# Patient Record
Sex: Female | Born: 1995 | Race: Asian | Hispanic: No | Marital: Single | Smoking: Never smoker
Health system: Southern US, Community
[De-identification: ages and names within clinical notes are randomized; demographics above are authoritative.]

## PROBLEM LIST (undated history)

## (undated) DIAGNOSIS — F329 Major depressive disorder, single episode, unspecified: Secondary | ICD-10-CM

## (undated) DIAGNOSIS — F32A Depression, unspecified: Secondary | ICD-10-CM

## (undated) DIAGNOSIS — F419 Anxiety disorder, unspecified: Secondary | ICD-10-CM

## (undated) HISTORY — DX: Major depressive disorder, single episode, unspecified: F32.9

## (undated) HISTORY — DX: Anxiety disorder, unspecified: F41.9

## (undated) HISTORY — DX: Depression, unspecified: F32.A

---
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
## 2014-01-01 ENCOUNTER — Ambulatory Visit (INDEPENDENT_AMBULATORY_CARE_PROVIDER_SITE_OTHER): Payer: BC Managed Care – PPO | Admitting: Family Medicine

## 2014-01-01 VITALS — BP 100/66 | HR 78 | Temp 97.9°F | Resp 18 | Ht 63.5 in | Wt 139.0 lb

## 2014-01-01 DIAGNOSIS — K089 Disorder of teeth and supporting structures, unspecified: Secondary | ICD-10-CM

## 2014-01-01 DIAGNOSIS — K0889 Other specified disorders of teeth and supporting structures: Secondary | ICD-10-CM

## 2014-01-01 MED ORDER — PENICILLIN V POTASSIUM 500 MG PO TABS: 500.0000 mg | ORAL_TABLET | Freq: Two times a day (BID) | ORAL | Status: DC

## 2014-01-01 MED ORDER — MELOXICAM 7.5 MG PO TABS: 7.5000 mg | ORAL_TABLET | Freq: Every day | ORAL | Status: DC

## 2014-01-01 NOTE — Progress Notes (Signed)
[facility] [address] [place] [address] [phone]  Date:  01/01/2014   Name:  Jenny Parks   DOB:  [birth year]   MRN:  [id number]  PCP:  Default, Provider, MD    Chief Complaint: Dental Pain   History of Present Illness:  Jenny Parks is a 18 y.o. very pleasant female patient who presents with the following:  She has had intermittent trouble with a tooth for a few months.  It is swollen again and painful for about 24 hours.   She is generally healthy.   He LMP was less than a month ago.  She has not noted a fever or vomiting.   She is not using any OTC medications She has not been to the dentist in a long time.   There are no active problems to display for this patient.   History reviewed. No pertinent past medical history.  History reviewed. No pertinent past surgical history.  History  Substance Use Topics  . Smoking status: Never Smoker   . Smokeless tobacco: Not on file  . Alcohol Use: No    Family History  Problem Relation Age of Onset  . Diabetes Paternal Grandfather     No Known Allergies  Medication list has been reviewed and updated.  Current Outpatient Prescriptions on File Prior to Visit  Medication Sig Dispense Refill  . triamcinolone cream (KENALOG) 0.1 % Apply topically 2 (two) times daily.  30 g  0   No current facility-administered medications on file prior to visit.    Review of Systems:  As per HPI- otherwise negative.   Physical Examination: Filed Vitals:   01/01/14 1708  BP: 100/66  Pulse: 78  Temp: 97.9 F (36.6 C)  Resp: 18   Filed Vitals:   01/01/14 1708  Height: 5' 3.5" (1.613 m)  Weight: 139 lb (63.05 kg)   Body mass index is 24.23 kg/(m^2). Ideal Body Weight: Weight in (lb) to have BMI = 25: 143.1  GEN: WDWN, NAD, Non-toxic, A & O x 3, looks well HEENT: Atraumatic, Normocephalic. Neck supple. No masses, No LAD.  Bilateral TM wnl, oropharynx normal except as below.  PEERL,EOMI.   Her teeth  show signs of dental neglect.  She has redness, tenderness and inflammation of the gum around #6 and 7.  No apparent pus collection in need of drainage.   Ears and Nose: No external deformity. CV: RRR, No M/G/R. No JVD. No thrill. No extra heart sounds. PULM: CTA B, no wheezes, crackles, rhonchi. No retractions. No resp. distress. No accessory muscle use. EXTR: No c/c/e NEURO Normal gait.  PSYCH: Normally interactive. Conversant. Not depressed or anxious appearing.  Calm demeanor.    Assessment and Plan: Pain, dental - Plan: penicillin v potassium (VEETID) 500 MG tablet, meloxicam (MOBIC) 7.5 MG tablet  Recurrent dental pain.  She appears to have a mild infection, and is badly in need of a dental cleaning.  Encouraged her to look into her dental insurance options.  Penicillin BID, and mobic as needed for pain.  She will follow-up if not better soon.    Signed Abbe Amsterdam, MD

## 2014-01-01 NOTE — Patient Instructions (Signed)
Use the penicillin as directed for your tooth infection.  You can use the mobic as needed for pain- 1 or 2 pills daily.  If your pain or swelling gets worse please come back in.   Please look into dental insurance.  If you are not able to get insurance you should still try and get your teeth cleaned and evaluated as soon as you can.

## 2014-02-03 ENCOUNTER — Ambulatory Visit (INDEPENDENT_AMBULATORY_CARE_PROVIDER_SITE_OTHER): Payer: BC Managed Care – PPO | Admitting: Family Medicine

## 2014-02-03 VITALS — BP 104/62 | HR 76 | Temp 98.6°F | Resp 18 | Ht 62.75 in | Wt 139.6 lb

## 2014-02-03 DIAGNOSIS — R21 Rash and other nonspecific skin eruption: Secondary | ICD-10-CM

## 2014-02-03 DIAGNOSIS — L259 Unspecified contact dermatitis, unspecified cause: Secondary | ICD-10-CM

## 2014-02-03 DIAGNOSIS — L309 Dermatitis, unspecified: Secondary | ICD-10-CM

## 2014-02-03 MED ORDER — NYSTATIN 100000 UNIT/GM EX CREA: 1.0000 "application " | TOPICAL_CREAM | Freq: Two times a day (BID) | CUTANEOUS | Status: DC

## 2014-02-03 MED ORDER — TRIAMCINOLONE ACETONIDE 0.1 % EX CREA: TOPICAL_CREAM | Freq: Two times a day (BID) | CUTANEOUS | Status: DC

## 2014-02-03 MED ORDER — DESONIDE 0.05 % EX CREA: TOPICAL_CREAM | Freq: Two times a day (BID) | CUTANEOUS | Status: DC | PRN

## 2014-02-03 NOTE — Progress Notes (Signed)
Chief Complaint:  Chief Complaint  Patient presents with  . Rash    rash around the both eyes and the neck --started 1 year ago and it just came back again--feels swollen--itchings    HPI: Jenny Parks is a 18 y.o. female who is here for rash on bialteral eye lids and also chronic rash on chest. She deneis fungal infections or sweating a lot.  She had allergies to shrimp and seafood.  Has had rash on eyes, attributed to using new mositurizer, has stopped using it but still present. + itchiness Has had neck rash, on and off for the last 1 year, gets better then gets worses, she has some dryness and itching , has no new meds or soaps except did have a new mositurizer which was only used on her face.  She denies any seasonal allergies. Has tried rx steroid cream we have given her in the past that has worked. She denies fevers, chills, new travels, new meds, new food, new clothes She is going to [employer] in Fall for a degree in elementary education   History reviewed. No pertinent past medical history. History reviewed. No pertinent past surgical history. History   Social History  . Marital Status: Single    Spouse Name: N/A    Number of Children: N/A  . Years of Education: N/A   Social History Main Topics  . Smoking status: Never Smoker   . Smokeless tobacco: None  . Alcohol Use: No  . Drug Use: No  . Sexual Activity: None   Other Topics Concern  . None   Social History Narrative  . None   Family History  Problem Relation Age of Onset  . Diabetes Paternal Grandfather    No Known Allergies Prior to Admission medications   Medication Sig Start Date End Date Taking? Authorizing Provider  meloxicam (MOBIC) 7.5 MG tablet Take 1 tablet (7.5 mg total) by mouth daily. Use as needed for pain.  May take 2 a day if needed 01/01/14  Yes Gwenlyn FoundJessica C Copland, MD  penicillin v potassium (VEETID) 500 MG tablet Take 1 tablet (500 mg total) by mouth 2 (two) times daily. 01/01/14  Yes  Gwenlyn FoundJessica C Copland, MD  triamcinolone cream (KENALOG) 0.1 % Apply topically 2 (two) times daily. 11/09/12   Eleanore Delia ChimesE Egan, PA-C     ROS: The patient denies fevers, chills, night sweats, unintentional weight loss, chest pain, palpitations, wheezing, dyspnea on exertion, nausea, vomiting, abdominal pain, dysuria, hematuria, melena, numbness, weakness, or tingling.   All other systems have been reviewed and were otherwise negative with the exception of those mentioned in the HPI and as above.    PHYSICAL EXAM: Filed Vitals:   02/03/14 1005  BP: 104/62  Pulse: 76  Temp: 98.6 F (37 C)  Resp: 18   Filed Vitals:   02/03/14 1005  Height: 5' 2.75" (1.594 m)  Weight: 139 lb 9.6 oz (63.322 kg)   Body mass index is 24.92 kg/(m^2).  General: Alert, no acute distress HEENT:  Normocephalic, atraumatic, oropharynx patent. EOMI, PERRLA Cardiovascular:  Regular rate and rhythm, no rubs murmurs or gallops.  No Carotid bruits, radial pulse intact. No pedal edema.  Respiratory: Clear to auscultation bilaterally.  No wheezes, rales, or rhonchi.  No cyanosis, no use of accessory musculature GI: No organomegaly, abdomen is soft and non-tender, positive bowel sounds.  No masses. Skin: + eczematous ? Fungal rash on neck,  Large macular beffy lesion on red, may have  had satellite lesions but now they have al coalesced into one large palm size rash on chest. + eczema on eyes rashes. Neurologic: Facial musculature symmetric. Psychiatric: Patient is appropriate throughout our interaction. Lymphatic: No cervical lymphadenopathy Musculoskeletal: Gait intact.   LABS: Results for orders placed in visit on 07/05/12  TSH      Result Value Ref Range   TSH 1.728  0.400 - 5.000 uIU/mL  POCT CBC      Result Value Ref Range   WBC 7.4  4.6 - 10.2 K/uL   Lymph, poc 1.2  0.6 - 3.4   POC LYMPH PERCENT 16.8  10 - 50 %L   MID (cbc) 0.3  0 - 0.9   POC MID % 3.4  0 - 12 %M   POC Granulocyte 5.9  2 - 6.9    Granulocyte percent 79.8  37 - 80 %G   RBC 5.25  4.04 - 5.48 M/uL   Hemoglobin 14.8  12.2 - 16.2 g/dL   HCT, POC 28.446.5  13.237.7 - 47.9 %   MCV 88.6  80 - 97 fL   MCH, POC 28.2  27 - 31.2 pg   MCHC 31.8  31.8 - 35.4 g/dL   RDW, POC 44.013.4     Platelet Count, POC 279  142 - 424 K/uL   MPV 9.0  0 - 99.8 fL  POCT URINE PREGNANCY      Result Value Ref Range   Preg Test, Ur Negative       EKG/XRAY:   [facility]   ASSESSMENT/PLAN: Encounter Diagnoses  Name Primary?  . Eczema Yes  . Rash    Rx desonide for eyes Rx kenaolog , nysatin for neck  She will try the nystatin to see if this may be also fungal related. If it does not work then she will continue with kenalog. Offered to send her to alelrgist but she declined F/u prn  Gross sideeffects, risk and benefits, and alternatives of medications d/w patient. Patient is aware that all medications have potential sideeffects and we are unable to predict every sideeffect or drug-drug interaction that may occur.  LE, THAO PHUONG, DO 02/03/2014 11:14 AM

## 2014-03-23 ENCOUNTER — Ambulatory Visit (INDEPENDENT_AMBULATORY_CARE_PROVIDER_SITE_OTHER): Payer: BC Managed Care – PPO | Admitting: Family Medicine

## 2014-03-23 VITALS — BP 98/56 | HR 81 | Temp 97.2°F | Resp 16 | Ht 63.0 in | Wt 140.2 lb

## 2014-03-23 DIAGNOSIS — L0201 Cutaneous abscess of face: Secondary | ICD-10-CM

## 2014-03-23 DIAGNOSIS — L03211 Cellulitis of face: Principal | ICD-10-CM

## 2014-03-23 MED ORDER — DOXYCYCLINE HYCLATE 100 MG PO CAPS: 100.0000 mg | ORAL_CAPSULE | Freq: Two times a day (BID) | ORAL | Status: DC

## 2014-03-23 NOTE — Progress Notes (Signed)
   Subjective:    Patient ID: Jenny Parks, female    DOB: 1996-07-19, 18 y.o.   MRN: [id number]  HPI Patient presents today with rash below her left eye. It has gotten progressively larger in the last two days and itches. She first noticed it 2 days ago. No other lesions. Does not think she has been exposed to any insects. No other lesions.  She has had several rashes in the past, none like this. She has steroid creams at home, but was afraid to use on her face.  She will be going to college ([employer]) in 5 days. She is a little apprehensive about leaving home. She will be rooming with a good friend of hers. She has never been sexually active.  Review of Systems No fever, no URI symptoms, no drainage from eyes, no visual changes.     Objective:   Physical Exam  Vitals reviewed. Constitutional: She is oriented to person, place, and time. She appears well-developed and well-nourished.  HENT:  Head: Normocephalic and atraumatic.    Right Ear: External ear normal.  Left Ear: External ear normal.  Eyes: Conjunctivae and EOM are normal. Pupils are equal, round, and reactive to light. Right eye exhibits no discharge. Left eye exhibits no discharge.  Neck: Normal range of motion. Neck supple.  Cardiovascular: Normal rate.   Pulmonary/Chest: Effort normal.  Musculoskeletal: Normal range of motion.  Neurological: She is alert and oriented to person, place, and time.  Skin: Skin is warm and dry.  Psychiatric: She has a normal mood and affect. Her behavior is normal. Judgment and thought content normal.       Assessment & Plan:  Discussed with Dr. Clelia CroftShaw who also examined the patient.  1. Cellulitis and abscess of face - doxycycline (VIBRAMYCIN) 100 MG capsule; Take 1 capsule (100 mg total) by mouth 2 (two) times daily.  Dispense: 20 capsule; Refill: 0 -patient can use desonide that she has at home- twice a day for 7-10 days. Patient instructed to use sparingly. -Instructed patient to RTC if no  improvement in 3-4 days (prior to leaving town) or sooner if worsening or eye involvement.  Emi Belfasteborah B. Gessner, FNP-BC  [facility]  03/25/2014 9:13 PM

## 2014-03-23 NOTE — Patient Instructions (Addendum)
Use desonide cream sparingly 2 times a day to rash Take antibiotic until finished Can take benadryl (generic fine) for itching  Cellulitis Cellulitis is an infection of the skin and the tissue beneath it. The infected area is usually red and tender. Cellulitis occurs most often in the arms and lower legs.  CAUSES  Cellulitis is caused by bacteria that enter the skin through cracks or cuts in the skin. The most common types of bacteria that cause cellulitis are staphylococci and streptococci. SIGNS AND SYMPTOMS   Redness and warmth.  Swelling.  Tenderness or pain.  Fever. DIAGNOSIS  Your health care provider can usually determine what is wrong based on a physical exam. Blood tests may also be done. TREATMENT  Treatment usually involves taking an antibiotic medicine. HOME CARE INSTRUCTIONS   Take your antibiotic medicine as directed by your health care provider. Finish the antibiotic even if you start to feel better.  Keep the infected arm or leg elevated to reduce swelling.  Apply a warm cloth to the affected area up to 4 times per day to relieve pain.  Take medicines only as directed by your health care provider.  Keep all follow-up visits as directed by your health care provider. SEEK MEDICAL CARE IF:   You notice red streaks coming from the infected area.  Your red area gets larger or turns dark in color.  Your bone or joint underneath the infected area becomes painful after the skin has healed.  Your infection returns in the same area or another area.  You notice a swollen bump in the infected area.  You develop new symptoms.  You have a fever. SEEK IMMEDIATE MEDICAL CARE IF:   You feel very sleepy.  You develop vomiting or diarrhea.  You have a general ill feeling (malaise) with muscle aches and pains. MAKE SURE YOU:   Understand these instructions.  Will watch your condition.  Will get help right away if you are not doing well or get worse. Document  Released: 05/03/2005 Document Revised: 12/08/2013 Document Reviewed: 10/09/2011 [facility] Patient Information 2015 [employer], [place]. This information is not intended to replace advice given to you by your health care provider. Make sure you discuss any questions you have with your health care provider.

## 2014-06-26 ENCOUNTER — Other Ambulatory Visit: Payer: Self-pay | Admitting: Family Medicine

## 2015-08-05 ENCOUNTER — Ambulatory Visit (INDEPENDENT_AMBULATORY_CARE_PROVIDER_SITE_OTHER): Payer: BLUE CROSS/BLUE SHIELD

## 2015-08-05 ENCOUNTER — Ambulatory Visit (INDEPENDENT_AMBULATORY_CARE_PROVIDER_SITE_OTHER): Payer: BLUE CROSS/BLUE SHIELD | Admitting: Emergency Medicine

## 2015-08-05 VITALS — BP 112/72 | HR 85 | Temp 97.9°F | Resp 16 | Ht 64.0 in | Wt 142.0 lb

## 2015-08-05 DIAGNOSIS — R002 Palpitations: Secondary | ICD-10-CM | POA: Diagnosis not present

## 2015-08-05 DIAGNOSIS — R0602 Shortness of breath: Secondary | ICD-10-CM

## 2015-08-05 DIAGNOSIS — R112 Nausea with vomiting, unspecified: Secondary | ICD-10-CM

## 2015-08-05 DIAGNOSIS — F411 Generalized anxiety disorder: Secondary | ICD-10-CM | POA: Diagnosis not present

## 2015-08-05 LAB — COMPREHENSIVE METABOLIC PANEL
ALK PHOS: 68 U/L (ref 33–115)
ALT: 10 U/L (ref 6–29)
AST: 15 U/L (ref 10–30)
Albumin: 5.3 g/dL — ABNORMAL HIGH (ref 3.6–5.1)
BUN: 9 mg/dL (ref 7–25)
CO2: 23 mmol/L (ref 20–31)
CREATININE: 0.61 mg/dL (ref 0.50–1.10)
Calcium: 9.9 mg/dL (ref 8.6–10.2)
Chloride: 102 mmol/L (ref 98–110)
GLUCOSE: 74 mg/dL (ref 65–99)
Potassium: 3.8 mmol/L (ref 3.5–5.3)
SODIUM: 138 mmol/L (ref 135–146)
Total Bilirubin: 0.9 mg/dL (ref 0.2–1.2)
Total Protein: 8.2 g/dL — ABNORMAL HIGH (ref 6.1–8.1)

## 2015-08-05 LAB — POCT CBC
Granulocyte percent: 62.7 %G (ref 37–80)
HCT, POC: 42 % (ref 37.7–47.9)
HEMOGLOBIN: 14.4 g/dL (ref 12.2–16.2)
LYMPH, POC: 1.6 (ref 0.6–3.4)
MCH, POC: 27.9 pg (ref 27–31.2)
MCHC: 34.3 g/dL (ref 31.8–35.4)
MCV: 81.4 fL (ref 80–97)
MID (cbc): 0.2 (ref 0–0.9)
MPV: 7.2 fL (ref 0–99.8)
PLATELET COUNT, POC: 242 10*3/uL (ref 142–424)
POC Granulocyte: 3.1 (ref 2–6.9)
POC LYMPH PERCENT: 32.3 %L (ref 10–50)
POC MID %: 5 %M (ref 0–12)
RBC: 5.16 M/uL (ref 4.04–5.48)
RDW, POC: 12.6 %
WBC: 4.9 10*3/uL (ref 4.6–10.2)

## 2015-08-05 LAB — LIPASE: Lipase: 27 U/L (ref 7–60)

## 2015-08-05 LAB — POCT URINE PREGNANCY: Preg Test, Ur: NEGATIVE

## 2015-08-05 MED ORDER — PAROXETINE HCL 20 MG PO TABS: 20.0000 mg | ORAL_TABLET | Freq: Every day | ORAL | Status: DC

## 2015-08-05 MED ORDER — LORAZEPAM 1 MG PO TABS: 1.0000 mg | ORAL_TABLET | Freq: Three times a day (TID) | ORAL | Status: DC | PRN

## 2015-08-05 MED ORDER — ONDANSETRON 8 MG PO TBDP: 8.0000 mg | ORAL_TABLET | Freq: Three times a day (TID) | ORAL | Status: DC | PRN

## 2015-08-05 NOTE — Patient Instructions (Signed)
Generalized Anxiety Disorder Generalized anxiety disorder (GAD) is a mental disorder. It interferes with life functions, including relationships, work, and school. GAD is different from normal anxiety, which everyone experiences at some point in their lives in response to specific life events and activities. Normal anxiety actually helps us prepare for and get through these life events and activities. Normal anxiety goes away after the event or activity is over.  GAD causes anxiety that is not necessarily related to specific events or activities. It also causes excess anxiety in proportion to specific events or activities. The anxiety associated with GAD is also difficult to control. GAD can vary from mild to severe. People with severe GAD can have intense waves of anxiety with physical symptoms (panic attacks).  SYMPTOMS The anxiety and worry associated with GAD are difficult to control. This anxiety and worry are related to many life events and activities and also occur more days than not for 6 months or longer. People with GAD also have three or more of the following symptoms (one or more in children):  Restlessness.   Fatigue.  Difficulty concentrating.   Irritability.  Muscle tension.  Difficulty sleeping or unsatisfying sleep. DIAGNOSIS GAD is diagnosed through an assessment by your health care provider. Your health care provider will ask you questions aboutyour mood,physical symptoms, and events in your life. Your health care provider may ask you about your medical history and use of alcohol or drugs, including prescription medicines. Your health care provider may also do a physical exam and blood tests. Certain medical conditions and the use of certain substances can cause symptoms similar to those associated with GAD. Your health care provider may refer you to a mental health specialist for further evaluation. TREATMENT The following therapies are usually used to treat GAD:    Medication. Antidepressant medication usually is prescribed for long-term daily control. Antianxiety medicines may be added in severe cases, especially when panic attacks occur.   Talk therapy (psychotherapy). Certain types of talk therapy can be helpful in treating GAD by providing support, education, and guidance. A form of talk therapy called cognitive behavioral therapy can teach you healthy ways to think about and react to daily life events and activities.  Stress managementtechniques. These include yoga, meditation, and exercise and can be very helpful when they are practiced regularly. A mental health specialist can help determine which treatment is best for you. Some people see improvement with one therapy. However, other people require a combination of therapies.   This information is not intended to replace advice given to you by your health care provider. Make sure you discuss any questions you have with your health care provider.   Document Released: 11/18/2012 Document Revised: 08/14/2014 Document Reviewed: 11/18/2012 Elsevier Interactive Patient Education 2016 Elsevier Inc.  

## 2015-08-05 NOTE — Progress Notes (Signed)
Subjective:  Patient ID: Jenny Parks, female    DOB: Oct 11, 1995  Age: 19 y.o. MRN: [id number]  CC: tightness in chest; heartbeat concerns; Cough; Bloated; and Nausea   HPI Jenny Parks presents   With numerous complaints. She describes having shortness of breath and tightness in her chest. This occurs intermittently. He is usually associated with the sensation in her heart is racing and skipping beats. She has no sputum production wheezing. She has no history of hemoptysis. No history of asthma or reactive airway disease. She said that she has abdominal discomfort nausea and some sensation in her abdomen is bloated. Pregnant. She has no dysuria urgency or frequency has no stool change or food intolerance. She has no symptoms suggestive of reflux. And she's not a smoker drinks alcohol occasionally in the does not overdo caffeine. She does admit to significant anxiety. 2  History Jenny Parks has no past medical history on file.   She has no past surgical history on file.   Her  family history includes Diabetes in her paternal grandfather.  She   reports that she has never smoked. She has never used smokeless tobacco. She reports that she does not drink alcohol or use illicit drugs.  Outpatient Prescriptions Prior to Visit  Medication Sig Dispense Refill  . desonide (DESOWEN) 0.05 % cream Apply topically 2 (two) times daily as needed. (Patient not taking: Reported on 08/05/2015) 30 g 0  . doxycycline (VIBRAMYCIN) 100 MG capsule Take 1 capsule (100 mg total) by mouth 2 (two) times daily. (Patient not taking: Reported on 08/05/2015) 20 capsule 0  . meloxicam (MOBIC) 7.5 MG tablet Take 1 tablet (7.5 mg total) by mouth daily. Use as needed for pain.  May take 2 a day if needed (Patient not taking: Reported on 08/05/2015) 30 tablet 0  . nystatin cream (MYCOSTATIN) Apply 1 application topically 2 (two) times daily. (Patient not taking: Reported on 08/05/2015) 30 g 0  . triamcinolone cream (KENALOG) 0.1  % Apply topically 2 (two) times daily. (Patient not taking: Reported on 08/05/2015) 30 g 2   No facility-administered medications prior to visit.    Social History   Social History  . Marital Status: Single    Spouse Name: N/A  . Number of Children: N/A  . Years of Education: N/A   Social History Main Topics  . Smoking status: Never Smoker   . Smokeless tobacco: Never Used  . Alcohol Use: No  . Drug Use: No  . Sexual Activity: Not Asked   Other Topics Concern  . None   Social History Narrative     Review of Systems  Constitutional: Negative for fever, chills and appetite change.  HENT: Negative for congestion, ear pain, postnasal drip, sinus pressure and sore throat.   Eyes: Negative for pain and redness.  Respiratory: Positive for chest tightness and shortness of breath. Negative for cough and wheezing.   Cardiovascular: Positive for palpitations. Negative for leg swelling.  Gastrointestinal: Positive for nausea, vomiting and abdominal distention. Negative for abdominal pain, diarrhea, constipation and blood in stool.  Endocrine: Negative for polyuria.  Genitourinary: Negative for dysuria, urgency, frequency and flank pain.  Musculoskeletal: Negative for gait problem.  Skin: Negative for rash.  Neurological: Negative for weakness and headaches.  Psychiatric/Behavioral: Negative for confusion and decreased concentration. The patient is not nervous/anxious.     Objective:  BP 112/72 mmHg  Pulse 85  Temp(Src) 97.9 F (36.6 C) (Oral)  Resp 16  Ht  (1.626 m)  Wt 142 lb (64.411 kg)  BMI 24.36 kg/m2  SpO2 98%  LMP 07/07/2015 (Approximate)  Physical Exam  Constitutional: She is oriented to person, place, and time. She appears well-developed and well-nourished. No distress.  HENT:  Head: Normocephalic and atraumatic.  Right Ear: External ear normal.  Left Ear: External ear normal.  Nose: Nose normal.  Eyes: Conjunctivae and EOM are normal. Pupils are equal,  round, and reactive to light. No scleral icterus.  Neck: Normal range of motion. Neck supple. No tracheal deviation present.  Cardiovascular: Normal rate, regular rhythm and normal heart sounds.   Pulmonary/Chest: Effort normal. No respiratory distress. She has no wheezes. She has no rales.  Abdominal: She exhibits no mass. There is no tenderness. There is no rebound and no guarding.  Musculoskeletal: She exhibits no edema.  Lymphadenopathy:    She has no cervical adenopathy.  Neurological: She is alert and oriented to person, place, and time. Coordination normal.  Skin: Skin is warm and dry. No rash noted.  Psychiatric: She has a normal mood and affect. Her behavior is normal.      Assessment & Plan:   Jenny Parks was seen today for tightness in chest, heartbeat concerns, cough, bloated and nausea.  Diagnoses and all orders for this visit:  Palpitations -     POCT CBC -     Comprehensive metabolic panel -     Lipase -     EKG 12-Lead -     DG Chest 2 View; Future -     POCT urine pregnancy -     TSH  Non-intractable vomiting with nausea, unspecified vomiting type -     POCT CBC -     Comprehensive metabolic panel -     Lipase -     EKG 12-Lead -     DG Chest 2 View; Future -     POCT urine pregnancy  Shortness of breath -     POCT CBC -     Comprehensive metabolic panel -     Lipase -     EKG 12-Lead -     DG Chest 2 View; Future -     POCT urine pregnancy  Anxiety state  Other orders -     LORazepam (ATIVAN) 1 MG tablet; Take 1 tablet (1 mg total) by mouth every 8 (eight) hours as needed for anxiety. -     PARoxetine (PAXIL) 20 MG tablet; Take 1 tablet (20 mg total) by mouth daily. -     ondansetron (ZOFRAN-ODT) 8 MG disintegrating tablet; Take 1 tablet (8 mg total) by mouth every 8 (eight) hours as needed for nausea.   I have discontinued Jenny Parks's meloxicam, triamcinolone cream, desonide, nystatin cream, and doxycycline. I am also having her start on LORazepam,  PARoxetine, and ondansetron.  Meds ordered this encounter  Medications  . LORazepam (ATIVAN) 1 MG tablet    Sig: Take 1 tablet (1 mg total) by mouth every 8 (eight) hours as needed for anxiety.    Dispense:  90 tablet    Refill:  0  . PARoxetine (PAXIL) 20 MG tablet    Sig: Take 1 tablet (20 mg total) by mouth daily.    Dispense:  30 tablet    Refill:  5  . ondansetron (ZOFRAN-ODT) 8 MG disintegrating tablet    Sig: Take 1 tablet (8 mg total) by mouth every 8 (eight) hours as needed for nausea.    Dispense:  30 tablet    Refill:  0    Appropriate red flag conditions were discussed with the patient as well as actions that should be taken.  Patient expressed his understanding.  Follow-up: Return if symptoms worsen or fail to improve.  Carmelina DaneAnderson, Jeffery S, MD   [facility] reading (PRIMARY) by  Dr. Dareen PianoAnderson.  Negative chest.   Results for orders placed or performed in visit on 08/05/15  POCT CBC  Result Value Ref Range   WBC 4.9 4.6 - 10.2 K/uL   Lymph, poc 1.6 0.6 - 3.4   POC LYMPH PERCENT 32.3 10 - 50 %L   MID (cbc) 0.2 0 - 0.9   POC MID % 5.0 0 - 12 %M   POC Granulocyte 3.1 2 - 6.9   Granulocyte percent 62.7 37 - 80 %G   RBC 5.16 4.04 - 5.48 M/uL   Hemoglobin 14.4 12.2 - 16.2 g/dL   HCT, POC 96.042.0 45.437.7 - 47.9 %   MCV 81.4 80 - 97 fL   MCH, POC 27.9 27 - 31.2 pg   MCHC 34.3 31.8 - 35.4 g/dL   RDW, POC 09.812.6 %   Platelet Count, POC 242 142 - 424 K/uL   MPV 7.2 0 - 99.8 fL  POCT urine pregnancy  Result Value Ref Range   Preg Test, Ur Negative Negative

## 2015-08-06 LAB — TSH: TSH: 2.58 u[IU]/mL (ref 0.350–4.500)

## 2015-08-08 ENCOUNTER — Encounter: Payer: Self-pay | Admitting: *Deleted

## 2015-09-07 ENCOUNTER — Ambulatory Visit (INDEPENDENT_AMBULATORY_CARE_PROVIDER_SITE_OTHER): Payer: BLUE CROSS/BLUE SHIELD | Admitting: Family Medicine

## 2015-09-07 VITALS — BP 122/72 | HR 86 | Temp 98.5°F | Resp 17 | Ht 63.5 in | Wt 142.0 lb

## 2015-09-07 DIAGNOSIS — K589 Irritable bowel syndrome without diarrhea: Secondary | ICD-10-CM | POA: Diagnosis not present

## 2015-09-07 MED ORDER — DICYCLOMINE HCL 20 MG PO TABS: 20.0000 mg | ORAL_TABLET | Freq: Three times a day (TID) | ORAL | Status: AC

## 2015-09-07 NOTE — Patient Instructions (Signed)
I think that you likely have irritable bowel syndrome- this is not a dangerous condition but can cause bothersome symptoms of diarrhea and constipation  Try the bentyl medication 4x a day for a couple of weeks and let me know how this does for you.  If you do NOT feel better we can have you see a GI specialist.   Try to keep to a regular eating, exercise, and bathroom schedule. Focus on eating healthy foods with plenty of fiber (fruits and veggies) If you are getting worse or if you see any more blood please let me know

## 2015-09-07 NOTE — Progress Notes (Signed)
[facility] [address] [place] [address] [phone]  Date:  09/07/2015   Name:  Jenny Parks   DOB:  May 28, 1996   MRN:  [id number]  PCP:  Default, Provider, MD    Chief Complaint: Abdominal Pain and Diarrhea   History of Present Illness:  Jenny Parks is a 20 y.o. very pleasant female patient who presents with the following:  She has noted digestive problems since December of 2016.  It seemed to start after she ate some garlic bread.  She has not noted any connection to certain foods more recently.   She was taking probioitcs and laxatives at home. She has noted alternating diarrhea and constipation.  She has felt bloated. No nausea. No weight change She is unsure if there has been any mucus in her stools- she did not really notice She will have stomach pains some of the time.   She will feel better after she has a BM generally No vomiting  Wt Readings from Last 3 Encounters:  09/07/15 142 lb (64.411 kg)  08/05/15 142 lb (64.411 kg)  03/23/14 140 lb 3.2 oz (63.594 kg) (71 %*, Z = 0.56)   * Growth percentiles are based on CDC 2-20 Years data.   She is not aware of any family history of any stomach concerns, UC, etc She noted some bright red blood in the tissue after a BM today.  It was just on the tissue and she thought it was due to trauma from frequent BM.  The BM she had prior was uncomfortable  She had an unusually light menses last week.  She has never been sexualy active- not even once- so pregnancy is not a possibility.  We also did a negaitve HCG for her about 2 weeks ago when she was seen with palpitations.  We did CMP and CBC  There are no active problems to display for this patient.   No past medical history on file.  No past surgical history on file.  Social History  Substance Use Topics  . Smoking status: Never Smoker   . Smokeless tobacco: Never Used  . Alcohol Use: No    Family History  Problem Relation Age of Onset  .  Diabetes Paternal Grandfather     No Known Allergies  Medication list has been reviewed and updated.  No current outpatient prescriptions on file prior to visit.   No current facility-administered medications on file prior to visit.    Review of Systems:  As per HPI- otherwise negative.   Physical Examination: Filed Vitals:   09/07/15 1200  BP: 122/72  Pulse: 86  Temp: 98.5 F (36.9 C)  Resp: 17   Filed Vitals:   09/07/15 1200  Height: 5' 3.5" (1.613 m)  Weight: 142 lb (64.411 kg)   Body mass index is 24.76 kg/(m^2). Ideal Body Weight: Weight in (lb) to have BMI = 25: 143.1  GEN: WDWN, NAD, Non-toxic, A & O x 3, well appearing young woman HEENT: Atraumatic, Normocephalic. Neck supple. No masses, No LAD.  Bilateral TM wnl, oropharynx normal.  PEERL,EOMI.   Ears and Nose: No external deformity. CV: RRR, No M/G/R. No JVD. No thrill. No extra heart sounds. PULM: CTA B, no wheezes, crackles, rhonchi. No retractions. No resp. distress. No accessory muscle use. ABD: S, NT, ND, +BS. No rebound. No HSM.  Benign belly EXTR: No c/c/e NEURO Normal gait.  PSYCH: Normally interactive. Conversant. Not depressed or anxious appearing.  Calm demeanor.  Assessment and Plan: IBS (irritable bowel syndrome) - Plan: dicyclomine (BENTYL) 20 MG tablet  Here today with intermittent diarrhea and constipation.  Suspect that she may have IBS Will try bentyl for her Recent labs all normal She will let me know if not improved or if blood from rectum comes back- in this case will refer to GI   Signed Abbe Amsterdam, MD

## 2017-07-19 ENCOUNTER — Encounter: Payer: Self-pay | Admitting: Family Medicine

## 2017-07-19 ENCOUNTER — Other Ambulatory Visit: Payer: Self-pay

## 2017-07-19 ENCOUNTER — Ambulatory Visit: Payer: BLUE CROSS/BLUE SHIELD | Admitting: Family Medicine

## 2017-07-19 VITALS — BP 102/54 | HR 94 | Temp 98.4°F | Resp 16 | Ht 64.0 in | Wt 157.6 lb

## 2017-07-19 DIAGNOSIS — F4323 Adjustment disorder with mixed anxiety and depressed mood: Secondary | ICD-10-CM | POA: Diagnosis not present

## 2017-07-19 DIAGNOSIS — Z13220 Encounter for screening for lipoid disorders: Secondary | ICD-10-CM

## 2017-07-19 DIAGNOSIS — Z0000 Encounter for general adult medical examination without abnormal findings: Secondary | ICD-10-CM | POA: Diagnosis not present

## 2017-07-19 NOTE — Patient Instructions (Addendum)
   IF you received an x-ray today, you will receive an invoice from [facility]. Please contact [facility] at [phone] with questions or concerns regarding your invoice.   IF you received labwork today, you will receive an invoice from [facility]. Please contact [facility] at [phone] with questions or concerns regarding your invoice.   Our billing staff will not be able to assist you with questions regarding bills from these companies.  You will be contacted with the lab results as soon as they are available. The fastest way to get your results is to activate your My Chart account. Instructions are located on the last page of this paperwork. If you have not heard from us regarding the results in 2 weeks, please contact this office.     Adjustment Disorder, Adult Adjustment disorder is a group of symptoms that can develop after a stressful life event, such as the loss of a job or serious physical illness. The symptoms can affect how you feel, think, and act. They may interfere with your relationships. Adjustment disorder increases your risk of suicide and substance abuse. If this disorder is not managed early, it can develop into a more serious condition, such as major depressive disorder or post-traumatic stress disorder. What are the causes? This condition happens when you have trouble recovering from or coping with a stressful life event. What increases the risk? You are more likely to develop this condition if:  You have had depression or anxiety.  You are being treated for a long-term (chronic) illness.  You are being treated for an illness that cannot be cured (terminal illness).  You have a family history of mental illness.  What are the signs or symptoms? Symptoms of this condition include:  Extreme trouble doing daily tasks, such as going to work.  Sadness, depression, or crying spells.  Worrying a lot.  Loss of enjoyment.  Change in appetite  or weight.  Feelings of loss or hopelessness.  Thoughts of suicide.  Anxiety, worry, or nervousness.  Trouble sleeping.  Avoiding family and friends.  Fighting or vandalism.  Complaining of feeling sick without being ill.  Feeling dazed or disconnected.  Nightmares.  Trouble sleeping.  Irritability.  Reckless driving.  Poor work performance.  Ignoring bills.  Symptoms of this condition start within three months of the stressful event. They do not last more than six months, unless the stressful circumstances last longer. Normal grieving after the death of a loved one is not a symptom of this condition. How is this diagnosed? To diagnose this condition, your health care provider will ask about what has happened in your life and how it has affected you. He or she may also ask about your medical history and your use of medicines, alcohol, and other substances. Your health care provider may do a physical exam and order lab tests or other studies. You may be referred to a mental health specialist. How is this treated? Treatment options for this condition include:  Counseling or talk therapy. Talk therapy is usually provided by mental health specialists.  Medicines. Certain medicines may help with depression, anxiety, and sleep.  Support groups. These offer emotional support, advice, and guidance. They are made up of people who have had similar experiences.  Observation and time. This is sometimes called "watchful waiting." In this treatment, health care providers monitor your health and behavior without other treatment. Adjustment disorder sometimes gets better on its own with time.  Follow these instructions at home:  Take   over-the-counter and prescription medicines only as told by your health care provider.  Keep all follow-up visits as told by your health care provider. This is important. Contact a health care provider if:  Your symptoms do not improve in six  months.  Your symptoms get worse. Get help right away if:  You have serious thoughts about hurting yourself or someone else. If you ever feel like you may hurt yourself or others, or have thoughts about taking your own life, get help right away. You can go to your nearest emergency department or call:  Your local emergency services (911 in the [place]  A suicide crisis helpline, such as the National Suicide Prevention Lifeline at 1-800-273-8255. This is open 24 hours a day.  Summary  Adjustment disorder is a group of symptoms that can develop after a stressful life event, such as the loss of a job or serious physical illness. The symptoms can affect how you feel, think, and act. They may interfere with your relationships.  Symptoms of this condition start within three months of the stressful event. They do not last more than six months, unless the stressful circumstances last longer.  Treatment may include talk therapy, medicines, participation in a support group, or observation to see if symptoms improve.  Contact your health care provider if your symptoms get worse or do not improve in six months.  If you ever feel like you may hurt yourself or others, or have thoughts about taking your own life, get help right away. This information is not intended to replace advice given to you by your health care provider. Make sure you discuss any questions you have with your health care provider. Document Released: 03/28/2006 Document Revised: 09/22/2016 Document Reviewed: 09/22/2016 Elsevier Interactive Patient Education  2018 [employer]vier Inc.  

## 2017-07-19 NOTE — Progress Notes (Signed)
Chief Complaint  Patient presents with  . Annual Exam    complete physical exam without pap    Subjective:  Jenny Parks is a 21 y.o. female here for a health maintenance visit.  Patient is established pt  She is taking time off from school and her family disapproves and this is causing her some stress and anxiety  There are no active problems to display for this patient.   Past Medical History:  Diagnosis Date  . Anxiety   . Depression     No past surgical history on file.   Outpatient Medications Prior to Visit  Medication Sig Dispense Refill  . dicyclomine (BENTYL) 20 MG tablet Take 1 tablet (20 mg total) by mouth 4 (four) times daily -  before meals and at bedtime. (Patient not taking: Reported on 07/19/2017) 120 tablet 2   No facility-administered medications prior to visit.     Not on File   Family History  Problem Relation Age of Onset  . Hypertension Mother   . Hypertension Father   . Cancer Maternal Grandmother   . Cancer Maternal Grandfather   . Diabetes Paternal Grandfather      Health Habits: Dental Exam: up to date Eye Exam: up to date Exercise: 2 times/week on average Current exercise activities: walking/running Diet: eats on the go  Social History   Socioeconomic History  . Marital status: Single    Spouse name: Not on file  . Number of children: Not on file  . Years of education: Not on file  . Highest education level: Not on file  Social Needs  . Financial resource strain: Not on file  . Food insecurity - worry: Not on file  . Food insecurity - inability: Not on file  . Transportation needs - medical: Not on file  . Transportation needs - non-medical: Not on file  Occupational History  . Not on file  Tobacco Use  . Smoking status: Never Smoker  . Smokeless tobacco: Never Used  Substance and Sexual Activity  . Alcohol use: No    Alcohol/week: 0.0 oz  . Drug use: No  . Sexual activity: Not on file  Other Topics Concern  . Not  on file  Social History Narrative  . Not on file   Social History   Substance and Sexual Activity  Alcohol Use No  . Alcohol/week: 0.0 oz   Social History   Tobacco Use  Smoking Status Never Smoker  Smokeless Tobacco Never Used   Social History   Substance and Sexual Activity  Drug Use No    GYN: Sexual Health Menstrual status: regular menses LMP: Patient's last menstrual period was 06/19/2017. Last pap smear: see HM section History of abnormal pap smears: normal 01/2017 Sexually active: with female partner Current contraception: none  Health Maintenance: See under health Maintenance activity for review of completion dates as well.  There is no immunization history on file for this patient.    Depression Screen-PHQ2/9 [facility] 07/19/2017 09/07/2015 08/05/2015  Decreased Interest 3 0 0  Down, Depressed, Hopeless 1 0 1  PHQ - 2 Score 4 0 1  Altered sleeping 3 - -  Tired, decreased energy 2 - -  Change in appetite 3 - -  Feeling bad or failure about yourself  2 - -  Trouble concentrating 0 - -  Moving slowly or fidgety/restless 1 - -  Suicidal thoughts 0 - -  PHQ-9 Score 15 - -  Difficult doing work/chores Very difficult - -  Depression Severity and Treatment Recommendations:  0-4= None  5-9= Mild / Treatment: Support, educate to call if worse; return in one month  10-14= Moderate / Treatment: Support, watchful waiting; Antidepressant or Psycotherapy  15-19= Moderately severe / Treatment: Antidepressant OR Psychotherapy  >= 20 = Major depression, severe / Antidepressant AND Psychotherapy    Review of Systems   Review of Systems  Constitutional: Negative for chills, fever and weight loss.  HENT: Negative for hearing loss and tinnitus.   Respiratory: Negative for cough, shortness of breath and wheezing.   Cardiovascular: Negative for chest pain and palpitations.  Gastrointestinal: Negative for abdominal pain, nausea and vomiting.    Genitourinary: Negative for dysuria, frequency and urgency.  Skin: Negative for itching and rash.  Neurological: Negative for dizziness, tingling and headaches.  Psychiatric/Behavioral: Negative for depression. The patient is not nervous/anxious.     See HPI for ROS as well.    Objective:   Vitals:   07/19/17 1558  BP: (!) 102/54  Pulse: 94  Resp: 16  Temp: 98.4 F (36.9 C)  TempSrc: Oral  SpO2: 99%  Weight: 157 lb 9.6 oz (71.5 kg)  Height: 5\' 4"  (1.626 m)    Body mass index is 27.05 kg/m.  Physical Exam  Constitutional: She is oriented to person, place, and time. She appears well-developed and well-nourished.  HENT:  Head: Normocephalic and atraumatic.  Right Ear: External ear normal.  Left Ear: External ear normal.  Eyes: Conjunctivae and EOM are normal.  Neck: Normal range of motion. No thyromegaly present.  Cardiovascular: Normal rate, regular rhythm and normal heart sounds.  Pulmonary/Chest: Effort normal and breath sounds normal. No respiratory distress.  Abdominal: Soft. Bowel sounds are normal. She exhibits no distension and no mass. There is no tenderness. There is no rebound and no guarding.  Musculoskeletal: Normal range of motion. She exhibits no edema.  Neurological: She is alert and oriented to person, place, and time. She has normal reflexes. No cranial nerve deficit.  Skin: Skin is warm. No erythema.  Psychiatric: She has a normal mood and affect. Her behavior is normal. Judgment and thought content normal.       Assessment/Plan:   Patient was seen for a health maintenance exam.  Counseled the patient on health maintenance issues. Reviewed her health mainteance schedule and ordered appropriate tests (see orders.) Counseled on regular exercise and weight management. Recommend regular eye exams and dental cleaning.   The following issues were addressed today for health maintenance:   Jenny Parks was seen today for annual exam.  Diagnoses and all orders  for this visit:  Health maintenance examination- reviewed age appropriate screenings  Adjustment disorder with mixed anxiety and depressed mood -     CBC -     TSH  Screening, lipid -     Lipid panel  Other orders -     Cancel: POCT urine pregnancy -     Cancel: Pap IG, CT/NG w/ reflex HPV when ASC-U    Return in about 1 year (around 07/19/2018).    Body mass index is 27.05 kg/m.:  Discussed the patient's BMI with patient. The BMI body mass index is 27.05 kg/m.     No future appointments.  Patient Instructions       IF you received an x-ray today, you will receive an invoice from [facility]. Please contact [facility] at [phone] with questions or concerns regarding your invoice.   IF you received labwork today, you will receive an invoice from  [facility]. Please contact [facility] at [phone] with questions or concerns regarding your invoice.   Our billing staff will not be able to assist you with questions regarding bills from these companies.  You will be contacted with the lab results as soon as they are available. The fastest way to get your results is to activate your My Chart account. Instructions are located on the last page of this paperwork. If you have not heard from us regarding the results in 2 weeks, please contact this office.     Adjustment Disorder, Adult Adjustment disorder is a group of symptoms that can develop after a stressful life event, such as the loss of a job or serious physical illness. The symptoms can affect how you feel, think, and act. They may interfere with your relationships. Adjustment disorder increases your risk of suicide and substance abuse. If this disorder is not managed early, it can develop into a more serious condition, such as major depressive disorder or post-traumatic stress disorder. What are the causes? This condition happens when you have trouble recovering from or coping with a stressful life  event. What increases the risk? You are more likely to develop this condition if:  You have had depression or anxiety.  You are being treated for a long-term (chronic) illness.  You are being treated for an illness that cannot be cured (terminal illness).  You have a family history of mental illness.  What are the signs or symptoms? Symptoms of this condition include:  Extreme trouble doing daily tasks, such as going to work.  Sadness, depression, or crying spells.  Worrying a lot.  Loss of enjoyment.  Change in appetite or weight.  Feelings of loss or hopelessness.  Thoughts of suicide.  Anxiety, worry, or nervousness.  Trouble sleeping.  Avoiding family and friends.  Fighting or vandalism.  Complaining of feeling sick without being ill.  Feeling dazed or disconnected.  Nightmares.  Trouble sleeping.  Irritability.  Reckless driving.  Poor work International aid/development workerperformance.  Ignoring bills.  Symptoms of this condition start within three months of the stressful event. They do not last more than six months, unless the stressful circumstances last longer. Normal grieving after the death of a loved one is not a symptom of this condition. How is this diagnosed? To diagnose this condition, your health care provider will ask about what has happened in your life and how it has affected you. He or she may also ask about your medical history and your use of medicines, alcohol, and other substances. Your health care provider may do a physical exam and order lab tests or other studies. You may be referred to a mental health specialist. How is this treated? Treatment options for this condition include:  Counseling or talk therapy. Talk therapy is usually provided by mental health specialists.  Medicines. Certain medicines may help with depression, anxiety, and sleep.  Support groups. These offer emotional support, advice, and guidance. They are made up of people who have had similar  experiences.  Observation and time. This is sometimes called "watchful waiting." In this treatment, health care providers monitor your health and behavior without other treatment. Adjustment disorder sometimes gets better on its own with time.  Follow these instructions at home:  Take over-the-counter and prescription medicines only as told by your health care provider.  Keep all follow-up visits as told by your health care provider. This is important. Contact a health care provider if:  Your symptoms do not improve in six months.  Your symptoms get worse. Get help right away if:  You have serious thoughts about hurting yourself or someone else. If you ever feel like you may hurt yourself or others, or have thoughts about taking your own life, get help right away. You can go to your nearest emergency department or call:  Your local emergency services (911 in the [place]  A suicide crisis helpline, such as the National Suicide Prevention Lifeline at 904 874 7727. This is open 24 hours a day.  Summary  Adjustment disorder is a group of symptoms that can develop after a stressful life event, such as the loss of a job or serious physical illness. The symptoms can affect how you feel, think, and act. They may interfere with your relationships.  Symptoms of this condition start within three months of the stressful event. They do not last more than six months, unless the stressful circumstances last longer.  Treatment may include talk therapy, medicines, participation in a support group, or observation to see if symptoms improve.  Contact your health care provider if your symptoms get worse or do not improve in six months.  If you ever feel like you may hurt yourself or others, or have thoughts about taking your own life, get help right away. This information is not intended to replace advice given to you by your health care provider. Make sure you discuss any questions you have with your  health care provider. Document Released: 03/28/2006 Document Revised: 09/22/2016 Document Reviewed: 09/22/2016 Elsevier Interactive Patient Education  [employer].

## 2017-07-20 ENCOUNTER — Encounter: Payer: BLUE CROSS/BLUE SHIELD | Admitting: Family Medicine

## 2017-07-20 LAB — TSH: TSH: 2.17 u[IU]/mL (ref 0.450–4.500)

## 2017-07-20 LAB — CBC
HEMOGLOBIN: 13.3 g/dL (ref 11.1–15.9)
Hematocrit: 38.3 % (ref 34.0–46.6)
MCH: 27.9 pg (ref 26.6–33.0)
MCHC: 34.7 g/dL (ref 31.5–35.7)
MCV: 81 fL (ref 79–97)
Platelets: 279 10*3/uL (ref 150–379)
RBC: 4.76 x10E6/uL (ref 3.77–5.28)
RDW: 13.6 % (ref 12.3–15.4)
WBC: 5.9 10*3/uL (ref 3.4–10.8)

## 2017-07-20 LAB — LIPID PANEL
CHOLESTEROL TOTAL: 182 mg/dL (ref 100–199)
Chol/HDL Ratio: 3.8 ratio (ref 0.0–4.4)
HDL: 48 mg/dL (ref >39)
LDL Calculated: 103 mg/dL — ABNORMAL HIGH (ref 0–99)
Triglycerides: 153 mg/dL — ABNORMAL HIGH (ref 0–149)
VLDL CHOLESTEROL CAL: 31 mg/dL (ref 5–40)

## 2017-12-17 IMAGING — CR DG CHEST 2V
2 series · 2 of 2 positions shown · non-contrast
Comparison: None.

CLINICAL DATA: Chest pain and cardiac palpitations

EXAM:
CHEST  2 VIEW

[PA]
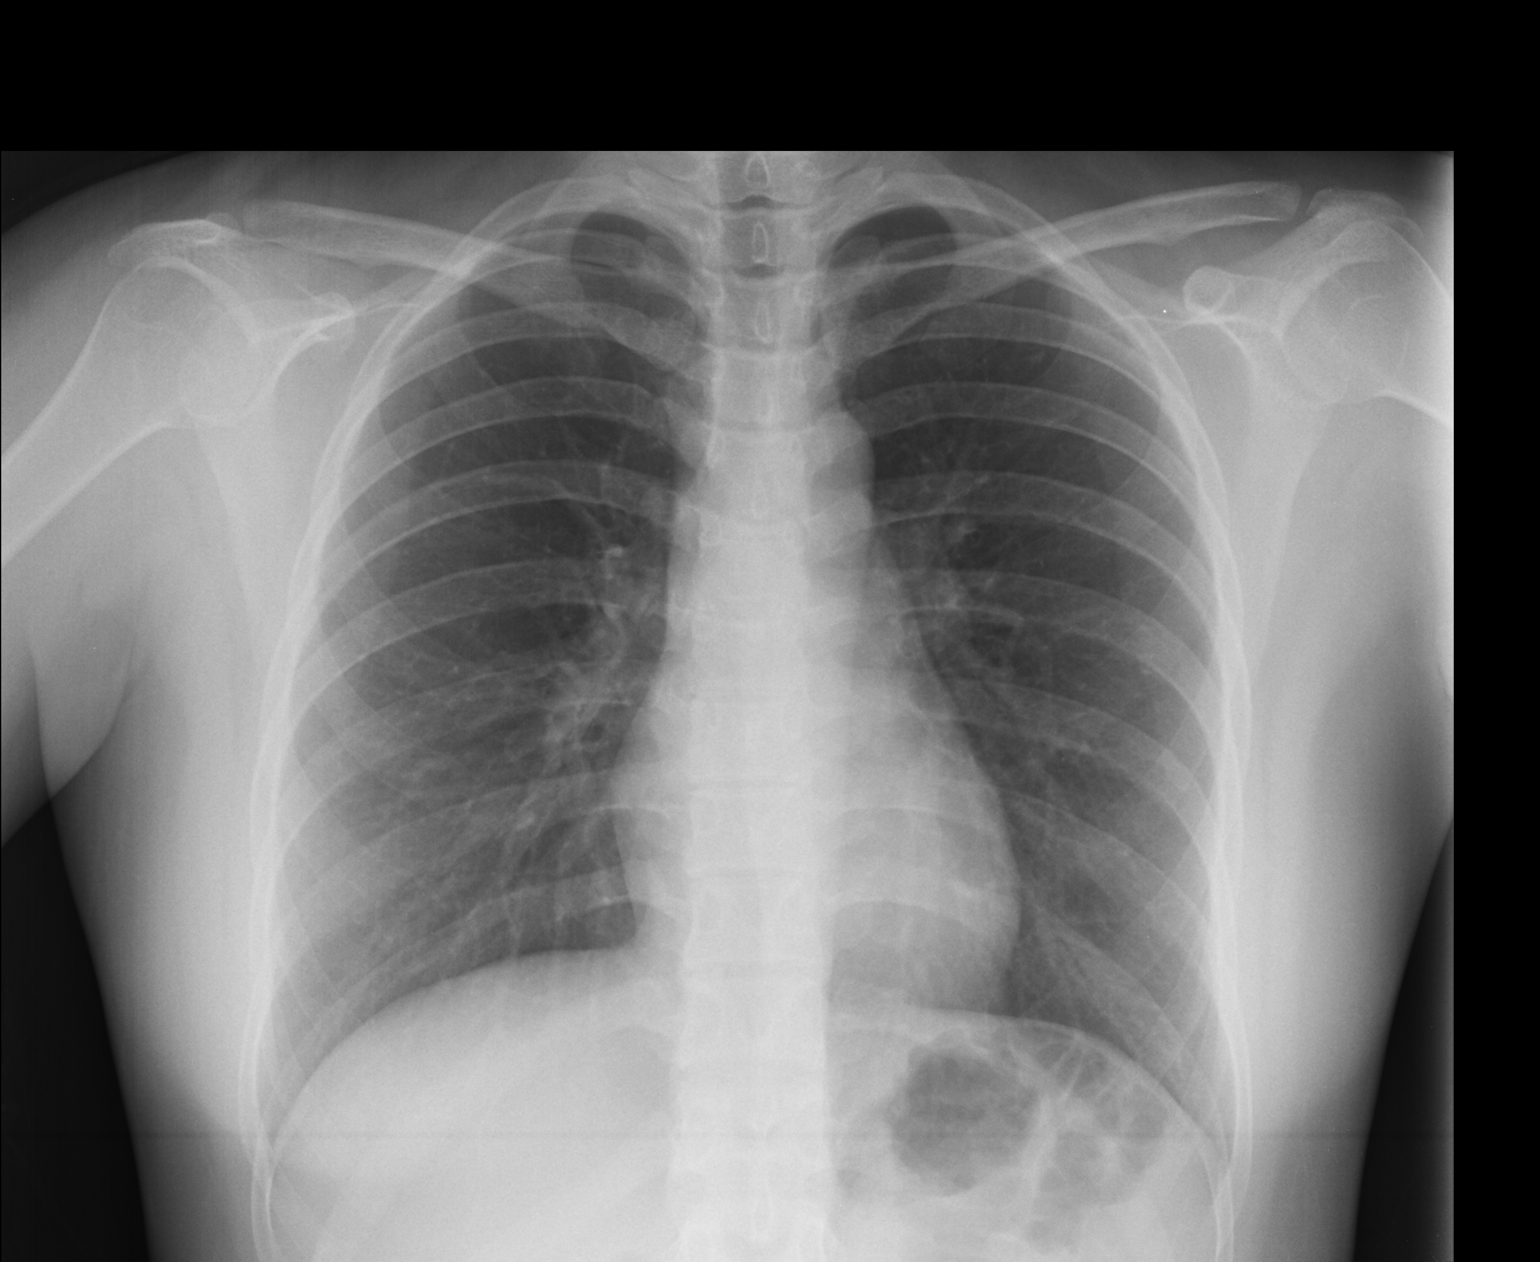

[lateral]
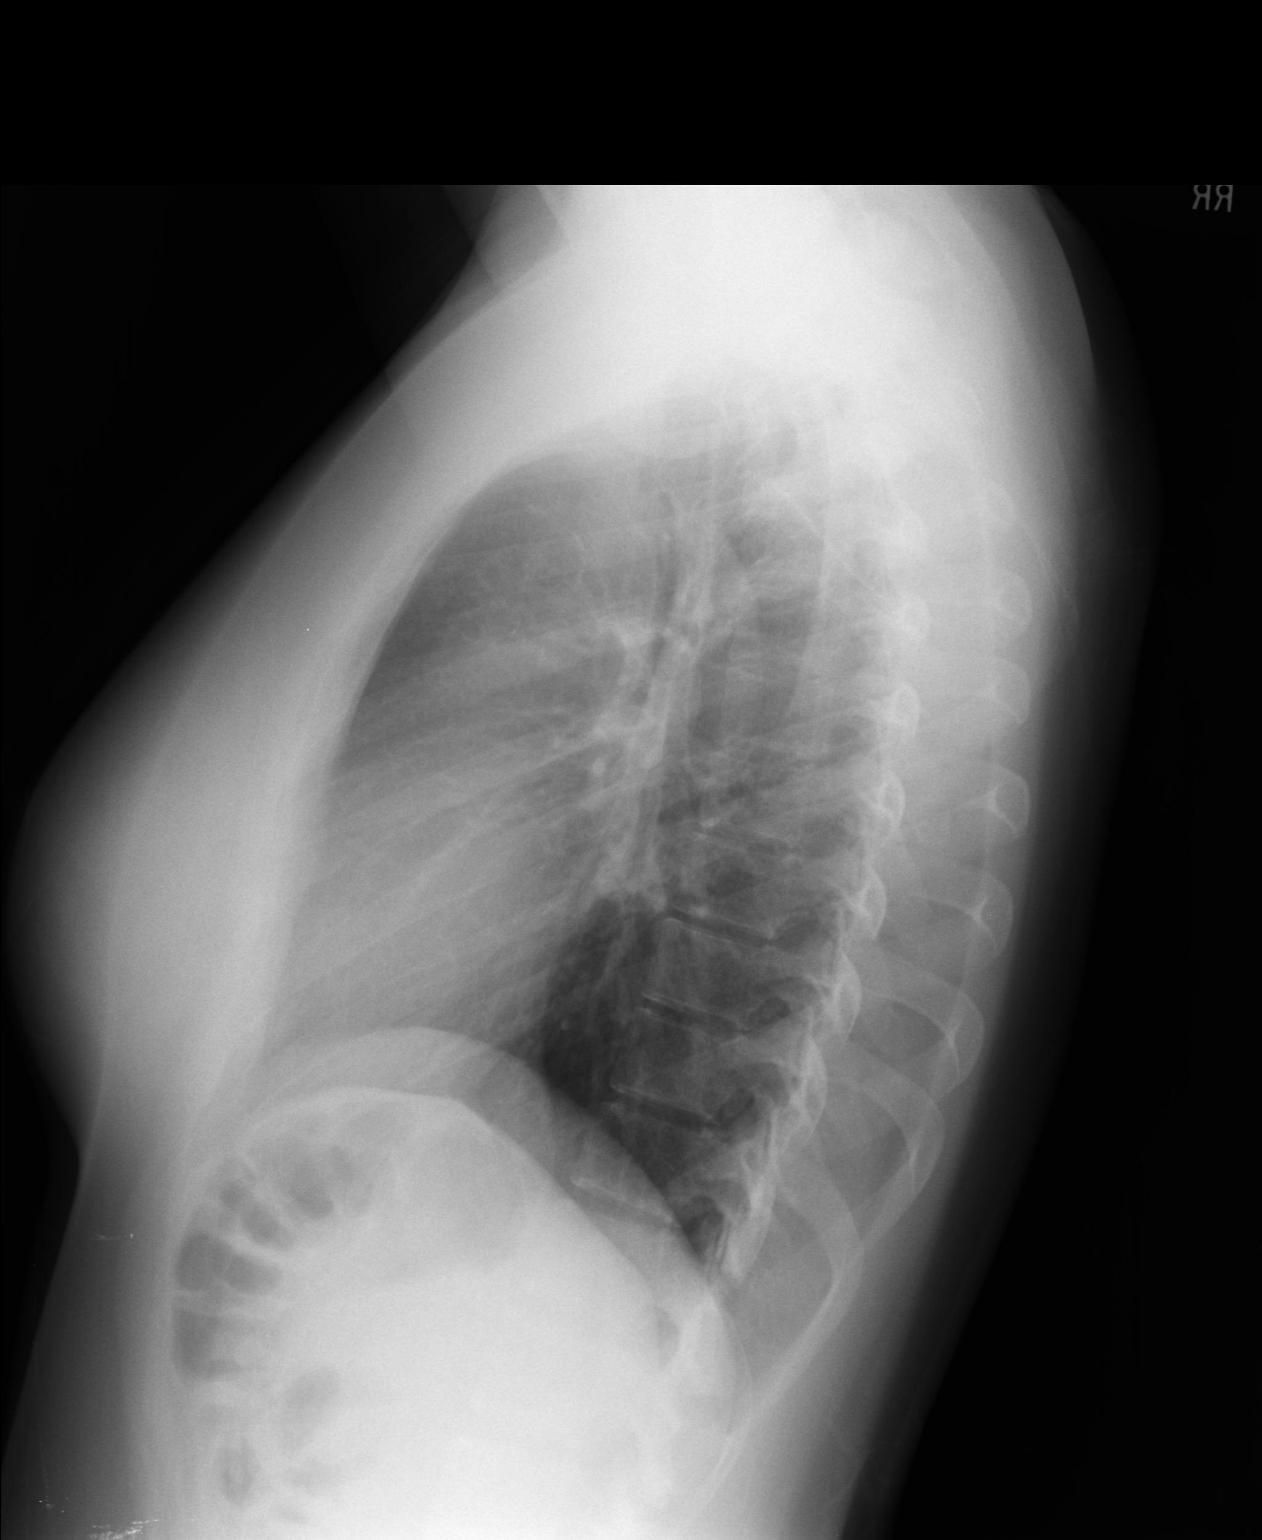

[2 of 2 positions shown; findings below may reference images not displayed]

FINDINGS: Lungs are clear. Heart size and pulmonary vascularity are normal. No
adenopathy. No pneumothorax. No bone lesions.
IMPRESSION: No abnormality noted.
